# Patient Record
Sex: Male | Born: 1980 | Race: Black or African American | Hispanic: No | Marital: Single | Smoking: Current every day smoker
Health system: Southern US, Community
[De-identification: ages and names within clinical notes are randomized; demographics above are authoritative.]

## PROBLEM LIST (undated history)

## (undated) DIAGNOSIS — Q250 Patent ductus arteriosus: Secondary | ICD-10-CM

## (undated) HISTORY — PX: CARDIAC SURGERY: SHX584

## (undated) HISTORY — PX: SPLENECTOMY, TOTAL: SHX788

---

## 1999-09-09 ENCOUNTER — Emergency Department (HOSPITAL_COMMUNITY): Admission: EM | Admit: 1999-09-09 | Discharge: 1999-09-09 | Payer: Self-pay | Admitting: *Deleted

## 2001-06-25 ENCOUNTER — Encounter: Payer: Self-pay | Admitting: Emergency Medicine

## 2001-06-25 ENCOUNTER — Inpatient Hospital Stay (HOSPITAL_COMMUNITY): Admission: AC | Admit: 2001-06-25 | Discharge: 2001-07-03 | Payer: Self-pay

## 2001-06-26 ENCOUNTER — Encounter: Payer: Self-pay | Admitting: Emergency Medicine

## 2001-06-28 ENCOUNTER — Encounter: Payer: Self-pay | Admitting: General Surgery

## 2001-06-30 ENCOUNTER — Encounter: Payer: Self-pay | Admitting: General Surgery

## 2001-07-03 ENCOUNTER — Encounter: Payer: Self-pay | Admitting: General Surgery

## 2007-06-24 ENCOUNTER — Emergency Department: Payer: Self-pay | Admitting: Unknown Physician Specialty

## 2011-03-26 ENCOUNTER — Inpatient Hospital Stay (INDEPENDENT_AMBULATORY_CARE_PROVIDER_SITE_OTHER)
Admission: RE | Admit: 2011-03-26 | Discharge: 2011-03-26 | Disposition: A | Discharge status: Home / Self Care | Payer: Self-pay | Source: Ambulatory Visit | Attending: Family Medicine | Admitting: Family Medicine

## 2011-03-26 ENCOUNTER — Ambulatory Visit (INDEPENDENT_AMBULATORY_CARE_PROVIDER_SITE_OTHER): Payer: Self-pay

## 2011-03-26 DIAGNOSIS — M25519 Pain in unspecified shoulder: Secondary | ICD-10-CM

## 2011-03-26 DIAGNOSIS — M549 Dorsalgia, unspecified: Secondary | ICD-10-CM

## 2012-01-17 ENCOUNTER — Ambulatory Visit (HOSPITAL_COMMUNITY)
Admission: RE | Admit: 2012-01-17 | Discharge: 2012-01-17 | Disposition: A | Discharge status: Home / Self Care | Payer: Medicaid Other | Source: Ambulatory Visit | Attending: Cardiology | Admitting: Cardiology

## 2012-01-17 ENCOUNTER — Other Ambulatory Visit (HOSPITAL_COMMUNITY): Payer: Self-pay | Admitting: Cardiology

## 2012-01-17 DIAGNOSIS — M25519 Pain in unspecified shoulder: Secondary | ICD-10-CM | POA: Insufficient documentation

## 2012-01-17 DIAGNOSIS — Q249 Congenital malformation of heart, unspecified: Secondary | ICD-10-CM

## 2012-01-17 DIAGNOSIS — R52 Pain, unspecified: Secondary | ICD-10-CM

## 2012-01-17 LAB — CBC
HCT: 43.6 % (ref 39.0–52.0)
Hemoglobin: 14.9 g/dL (ref 13.0–17.0)
MCH: 32.1 pg (ref 26.0–34.0)
MCHC: 34.2 g/dL (ref 30.0–36.0)
MCV: 94 fL (ref 78.0–100.0)
Platelets: 238 10*3/uL (ref 150–400)
RBC: 4.64 MIL/uL (ref 4.22–5.81)
RDW: 14.8 % (ref 11.5–15.5)
WBC: 10.6 10*3/uL — ABNORMAL HIGH (ref 4.0–10.5)

## 2012-01-17 LAB — COMPREHENSIVE METABOLIC PANEL
Alkaline Phosphatase: 49 U/L (ref 39–117)
BUN: 9 mg/dL (ref 6–23)
Chloride: 107 mEq/L (ref 96–112)
Creatinine, Ser: 0.72 mg/dL (ref 0.50–1.35)
GFR calc Af Amer: >90 mL/min (ref >90)
Glucose, Bld: 89 mg/dL (ref 70–99)
Potassium: 3.9 mEq/L (ref 3.5–5.1)
Total Bilirubin: 0.5 mg/dL (ref 0.3–1.2)
Total Protein: 7.4 g/dL (ref 6.0–8.3)

## 2012-01-17 NOTE — Progress Notes (Signed)
  Echocardiogram 2D Echocardiogram has been performed.  Kelly Bowen 01/17/2012, 11:26 AM

## 2012-01-22 ENCOUNTER — Ambulatory Visit (HOSPITAL_COMMUNITY): Admission: RE | Admit: 2012-01-22 | Payer: Medicaid Other | Source: Ambulatory Visit

## 2012-03-26 ENCOUNTER — Ambulatory Visit: Payer: Medicaid Other | Attending: Orthopaedic Surgery | Admitting: Physical Therapy

## 2012-03-26 DIAGNOSIS — M25559 Pain in unspecified hip: Secondary | ICD-10-CM | POA: Insufficient documentation

## 2012-03-26 DIAGNOSIS — IMO0001 Reserved for inherently not codable concepts without codable children: Secondary | ICD-10-CM | POA: Insufficient documentation

## 2012-03-26 DIAGNOSIS — M545 Low back pain, unspecified: Secondary | ICD-10-CM | POA: Insufficient documentation

## 2012-04-06 ENCOUNTER — Encounter: Payer: Self-pay | Admitting: Physical Therapy

## 2012-04-14 ENCOUNTER — Ambulatory Visit: Payer: Medicaid Other | Attending: Orthopaedic Surgery | Admitting: Physical Therapy

## 2012-04-14 DIAGNOSIS — M545 Low back pain, unspecified: Secondary | ICD-10-CM | POA: Insufficient documentation

## 2012-04-14 DIAGNOSIS — M25559 Pain in unspecified hip: Secondary | ICD-10-CM | POA: Insufficient documentation

## 2012-04-14 DIAGNOSIS — IMO0001 Reserved for inherently not codable concepts without codable children: Secondary | ICD-10-CM | POA: Insufficient documentation

## 2012-04-21 ENCOUNTER — Ambulatory Visit: Payer: Medicaid Other | Admitting: Physical Therapy

## 2012-04-28 ENCOUNTER — Encounter: Payer: Self-pay | Admitting: Physical Therapy

## 2012-05-08 ENCOUNTER — Encounter: Payer: Self-pay | Admitting: Physical Therapy

## 2012-05-18 ENCOUNTER — Encounter: Payer: Self-pay | Admitting: Physical Therapy

## 2012-08-03 DIAGNOSIS — K029 Dental caries, unspecified: Secondary | ICD-10-CM | POA: Insufficient documentation

## 2012-08-03 DIAGNOSIS — F17200 Nicotine dependence, unspecified, uncomplicated: Secondary | ICD-10-CM | POA: Insufficient documentation

## 2012-08-04 ENCOUNTER — Emergency Department (HOSPITAL_COMMUNITY)
Admission: EM | Admit: 2012-08-04 | Discharge: 2012-08-04 | Disposition: A | Discharge status: Home / Self Care | Payer: Medicaid Other | Attending: Emergency Medicine | Admitting: Emergency Medicine

## 2012-08-04 ENCOUNTER — Encounter (HOSPITAL_COMMUNITY): Payer: Self-pay | Admitting: *Deleted

## 2012-08-04 DIAGNOSIS — K029 Dental caries, unspecified: Secondary | ICD-10-CM

## 2012-08-04 HISTORY — DX: Patent ductus arteriosus: Q25.0

## 2012-08-04 MED ORDER — TRAMADOL HCL 50 MG PO TABS: 50.0000 mg | ORAL_TABLET | Freq: Four times a day (QID) | ORAL | Status: DC | PRN

## 2012-08-04 MED ORDER — PENICILLIN V POTASSIUM 500 MG PO TABS
500.0000 mg | ORAL_TABLET | Freq: Once | ORAL | Status: AC
  Administered 2012-08-04: 500 mg via ORAL
  Filled 2012-08-04: qty 1

## 2012-08-04 MED ORDER — TRAMADOL HCL 50 MG PO TABS
50.0000 mg | ORAL_TABLET | Freq: Once | ORAL | Status: AC
  Administered 2012-08-04: 50 mg via ORAL
  Filled 2012-08-04: qty 1

## 2012-08-04 MED ORDER — PENICILLIN V POTASSIUM 500 MG PO TABS: 500.0000 mg | ORAL_TABLET | Freq: Four times a day (QID) | ORAL | Status: DC

## 2012-08-04 NOTE — ED Notes (Signed)
Pt c/o right lower jaw tooth abscess; pain x 2 days

## 2012-08-04 NOTE — ED Provider Notes (Signed)
Medical screening examination/treatment/procedure(s) were performed by non-physician practitioner and as supervising physician I was immediately available for consultation/collaboration.   Lyanne Co, MD 08/04/12 [phone]

## 2012-08-04 NOTE — ED Provider Notes (Signed)
History     CSN: [id number]  Arrival date & time 08/03/12  2356   First MD Initiated Contact with Patient 08/04/12 0015      Chief Complaint  Patient presents with  . Dental Pain    (Consider location/radiation/quality/duration/timing/severity/associated sxs/prior treatment) HPI Comments: Patient was sent to the emergency department tonight, complaining of left lower tooth and gum pain.  This is been going on for the last several, days.  He is taken over-the-counter Orajel and Goody's powder, without relief he, states he plans on going to [facility] in the next one to 2 days for medication.  He does not have a dentist  Patient is a 31 y.o. male presenting with tooth pain. The history is provided by the patient.  Dental PainThe primary symptoms include mouth pain. Primary symptoms do not include dental injury, headaches, fever or sore throat. The symptoms began yesterday.  Additional symptoms do not include: trouble swallowing and ear pain.    Past Medical History  Diagnosis Date  . Patent ductus arteriosus     Past Surgical History  Procedure Date  . Cardiac surgery   . Splenectomy, total     No family history on file.  History  Substance Use Topics  . Smoking status: Current Every Day Smoker -- 1.5 packs/day    Types: Cigarettes  . Smokeless tobacco: Not on file  . Alcohol Use: No      Review of Systems  Constitutional: Negative for fever.  HENT: Positive for dental problem. Negative for ear pain, sore throat, rhinorrhea and trouble swallowing.   Gastrointestinal: Negative for nausea.  Skin: Negative for rash and wound.  Neurological: Negative for dizziness and headaches.    Allergies  Review of patient's allergies indicates no known allergies.  Home Medications   Current Outpatient Rx  Name Route Sig Dispense Refill  . ACETAMINOPHEN 500 MG PO TABS Oral Take 1,000 mg by mouth every 6 (six) hours as needed. For dental    . GOODY HEADACHE PO Oral Take 1  packet by mouth every 6 (six) hours as needed. For pain    . BENZOCAINE 10 % MT GEL Mouth/Throat Use as directed 1 application in the mouth or throat as needed.    Marland Kitchen PENICILLIN V POTASSIUM 500 MG PO TABS Oral Take 1 tablet (500 mg total) by mouth 4 (four) times daily. 39 tablet 0  . TRAMADOL HCL 50 MG PO TABS Oral Take 1 tablet (50 mg total) by mouth every 6 (six) hours as needed for pain. 30 tablet 0    BP 170/81  Pulse 56  Temp 98 F (36.7 C)  Resp 20  SpO2 100%  Physical Exam  Constitutional: He appears well-developed and well-nourished.  HENT:  Head: Normocephalic.  Mouth/Throat:    Eyes: Pupils are equal, round, and reactive to light.  Neck: Normal range of motion.    ED Course  Procedures (including critical care time)  Labs Reviewed - No data to display No results found.   1. Simple dental cavity       MDM   Will treat with PCN and Ultram refer to DDS in AM        Arman Filter, NP 08/04/12 0120

## 2015-03-21 ENCOUNTER — Ambulatory Visit (HOSPITAL_COMMUNITY)
Admission: RE | Admit: 2015-03-21 | Discharge: 2015-03-21 | Disposition: A | Discharge status: Home / Self Care | Payer: No Typology Code available for payment source | Source: Ambulatory Visit | Attending: Chiropractic Medicine | Admitting: Chiropractic Medicine

## 2015-03-21 ENCOUNTER — Other Ambulatory Visit (HOSPITAL_COMMUNITY): Payer: Self-pay | Admitting: Chiropractic Medicine

## 2015-03-21 DIAGNOSIS — M25512 Pain in left shoulder: Secondary | ICD-10-CM

## 2015-03-21 DIAGNOSIS — M5489 Other dorsalgia: Secondary | ICD-10-CM

## 2015-03-21 DIAGNOSIS — M545 Low back pain: Secondary | ICD-10-CM | POA: Insufficient documentation

## 2015-03-21 DIAGNOSIS — M4322 Fusion of spine, cervical region: Secondary | ICD-10-CM | POA: Diagnosis not present

## 2015-03-21 DIAGNOSIS — M79642 Pain in left hand: Secondary | ICD-10-CM | POA: Diagnosis not present

## 2015-03-21 DIAGNOSIS — M542 Cervicalgia: Secondary | ICD-10-CM

## 2015-03-21 DIAGNOSIS — M5136 Other intervertebral disc degeneration, lumbar region: Secondary | ICD-10-CM | POA: Insufficient documentation

## 2016-03-20 ENCOUNTER — Emergency Department (HOSPITAL_COMMUNITY): Payer: Medicaid Other

## 2016-03-20 ENCOUNTER — Emergency Department (HOSPITAL_COMMUNITY)
Admission: EM | Admit: 2016-03-20 | Discharge: 2016-03-20 | Disposition: A | Discharge status: Home / Self Care | Payer: Medicaid Other | Attending: Emergency Medicine | Admitting: Emergency Medicine

## 2016-03-20 ENCOUNTER — Encounter (HOSPITAL_COMMUNITY): Payer: Self-pay

## 2016-03-20 DIAGNOSIS — F17210 Nicotine dependence, cigarettes, uncomplicated: Secondary | ICD-10-CM | POA: Insufficient documentation

## 2016-03-20 DIAGNOSIS — Z792 Long term (current) use of antibiotics: Secondary | ICD-10-CM | POA: Insufficient documentation

## 2016-03-20 DIAGNOSIS — Z8774 Personal history of (corrected) congenital malformations of heart and circulatory system: Secondary | ICD-10-CM | POA: Insufficient documentation

## 2016-03-20 DIAGNOSIS — M542 Cervicalgia: Secondary | ICD-10-CM | POA: Insufficient documentation

## 2016-03-20 MED ORDER — NAPROXEN 500 MG PO TABS: 500.0000 mg | ORAL_TABLET | Freq: Two times a day (BID) | ORAL | Status: DC

## 2016-03-20 MED ORDER — NAPROXEN 250 MG PO TABS
500.0000 mg | ORAL_TABLET | Freq: Once | ORAL | Status: AC
  Administered 2016-03-20: 500 mg via ORAL
  Filled 2016-03-20: qty 2

## 2016-03-20 MED ORDER — DIAZEPAM 5 MG PO TABS: 5.0000 mg | ORAL_TABLET | Freq: Two times a day (BID) | ORAL | Status: DC

## 2016-03-20 MED ORDER — DIAZEPAM 5 MG PO TABS
5.0000 mg | ORAL_TABLET | Freq: Once | ORAL | Status: AC
Start: 2016-03-20 — End: 2016-03-20
  Administered 2016-03-20: 5 mg via ORAL
  Filled 2016-03-20: qty 1

## 2016-03-20 NOTE — Discharge Instructions (Signed)
Mr. Kelly Bowen,  Nice meeting you! Please follow-up with your primary care provider. If you do not have one, please call the number listed in the information packet. You may need further evaluation/imaging after one-two weeks. Return to the emergency department if you develop increased pain, inability to walk, new/worsening symptoms. Feel better soon!  S. Lane HackerNicole Eurydice Calixto, PA-C

## 2016-03-20 NOTE — ED Provider Notes (Signed)
CSN: [id number]     Arrival date & time 03/20/16  1633 History  By signing my name below, I, Kelly Bowen, attest that this documentation has been prepared under the direction and in the presence of Melton Krebs, PA-C. Electronically Signed: Evon Bowen, ED Scribe. 03/20/2016. 5:56 PM.    Chief Complaint  Patient presents with  . Neck Pain   Patient is a 35 y.o. male presenting with neck pain. The history is provided by the patient. No language interpreter was used.  Neck Pain Associated symptoms: no fever and no headaches    HPI Comments: Kelly Bowen is a 35 y.o. male who presents to the Emergency Department complaining of right sided neck pain onset 3 weeks prior. Pt doesn't report any associated symptoms. Pt states that he has tried a pain medication with no relief. Pt states that he is unable to remember the name of the medication. He reports some relief when lifting the left arm pain. Pt states that certain movements make the pain worse. Denies fever, chills, nausea, vomiting or HA. Pt denies injury or trauma. Pt denies IV drug use.    Past Medical History  Diagnosis Date  . Patent ductus arteriosus    Past Surgical History  Procedure Laterality Date  . Cardiac surgery    . Splenectomy, total     No family history on file. Social History  Substance Use Topics  . Smoking status: Current Every Day Smoker -- 1.50 packs/day    Types: Cigarettes  . Smokeless tobacco: Never Used  . Alcohol Use: No    Review of Systems  Constitutional: Negative for fever and chills.  Gastrointestinal: Negative for nausea and vomiting.  Musculoskeletal: Positive for neck pain.  Neurological: Negative for headaches.  A complete 10 system review of systems was obtained and all systems are negative except as noted in the HPI and PMH.      Allergies  Review of patient's allergies indicates no known allergies.  Home Medications   Prior to Admission medications   Medication  Sig Start Date End Date Taking? Authorizing Provider  acetaminophen (TYLENOL) 500 MG tablet Take 1,000 mg by mouth every 6 (six) hours as needed. For dental    Historical Provider, MD  Aspirin-Acetaminophen-Caffeine (GOODY HEADACHE PO) Take 1 packet by mouth every 6 (six) hours as needed. For pain    Historical Provider, MD  benzocaine (ORAJEL) 10 % mucosal gel Use as directed 1 application in the mouth or throat as needed.    Historical Provider, MD  penicillin v potassium (VEETID) 500 MG tablet Take 1 tablet (500 mg total) by mouth 4 (four) times daily. 08/04/12   Earley Favor, NP  traMADol (ULTRAM) 50 MG tablet Take 1 tablet (50 mg total) by mouth every 6 (six) hours as needed for pain. 08/04/12   Earley Favor, NP   BP 122/72 mmHg  Pulse 76  Temp(Src) 97.8 F (36.6 C) (Oral)  Resp 17  SpO2 99%   Physical Exam  Constitutional: He is oriented to person, place, and time. He appears well-developed and well-nourished. No distress.  HENT:  Head: Normocephalic and atraumatic.  Eyes: Conjunctivae and EOM are normal.  Neck: Neck supple. No tracheal deviation present.  midline cervical tenderness and right cervical paraspinal  tenderness no step off or deformity  Cardiovascular: Normal rate.   Pulmonary/Chest: Effort normal. No respiratory distress.  Musculoskeletal: Normal range of motion.  No midline thoracic, lumbar spinal tenderness  Neurological: He is alert and oriented to  person, place, and time.  Skin: Skin is warm and dry.  Psychiatric: He has a normal mood and affect. His behavior is normal.  Nursing note and vitals reviewed.   ED Course  Procedures (including critical care time)   COORDINATION OF CARE: 4:43 PM-Discussed treatment plan with pt at bedside and pt agreed to plan.   Imaging Review Dg Cervical Spine Complete  03/20/2016  CLINICAL DATA:  Severe neck pain for 3 weeks.  No known injury. EXAM: CERVICAL SPINE - COMPLETE 4+ VIEW COMPARISON:  Cervical spine radiographs  03/21/2015. 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 The prevertebral soft tissues are normal. There is stable straightening of the usual cervical lordosis. There is no focal angulation or listhesis. No evidence of acute fracture or traumatic subluxation. There is spondylosis with loss of disc height, posterior osteophytes and uncinate spurring at C4-5 and C5-6, contributing to mild biforaminal narrowing. The posterior arch of C1 is incomplete. Surgical clips are present in the superior mediastinum. IMPRESSION: Stable cervical spine radiographs demonstrating prominent spondylosis for age. No acute osseous findings are evident. 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 Electronically Signed   By: Carey BullocksWilliam  Veazey M.D.   On: 03/20/2016 17:52   MDM   Final diagnoses:  Neck pain   Patient with neuro pain.  No neurological deficits and normal neuro exam.  No fever, night sweats, weight loss, h/o cancer, IVDU.  RICE protocol and pain medicine indicated and discussed with patient.   I personally performed the services described in this documentation, which was scribed in my presence. The recorded information has been reviewed and is accurate.   Melton KrebsSamantha Nicole Deajah Erkkila, PA-C 03/27/16 1941  Raeford RazorStephen Kohut, MD 03/28/16 0040

## 2016-03-20 NOTE — ED Notes (Signed)
Social work at bedside.  

## 2016-03-20 NOTE — ED Notes (Signed)
C/o neck pain x3 weeks denies injury

## 2016-03-20 NOTE — ED Notes (Signed)
Pt stable, ambulatory, states understanding of discharge instructions 

## 2016-04-05 ENCOUNTER — Inpatient Hospital Stay: Payer: Self-pay | Admitting: Family Medicine

## 2018-01-22 IMAGING — DX DG CERVICAL SPINE COMPLETE 4+V
6 series · 6 of 6 positions shown · non-contrast
Comparison: Cervical spine radiographs 03/21/2015.

CLINICAL DATA: Severe neck pain for 3 weeks.  No known injury.

EXAM:
CERVICAL SPINE - COMPLETE 4+ VIEW

[w cervical spine lat]
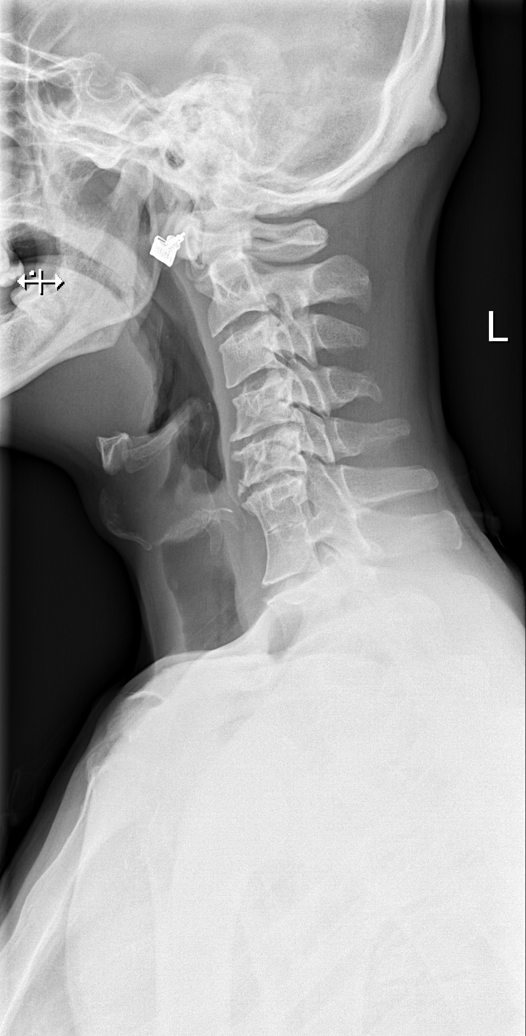

[w cervical swimmers]
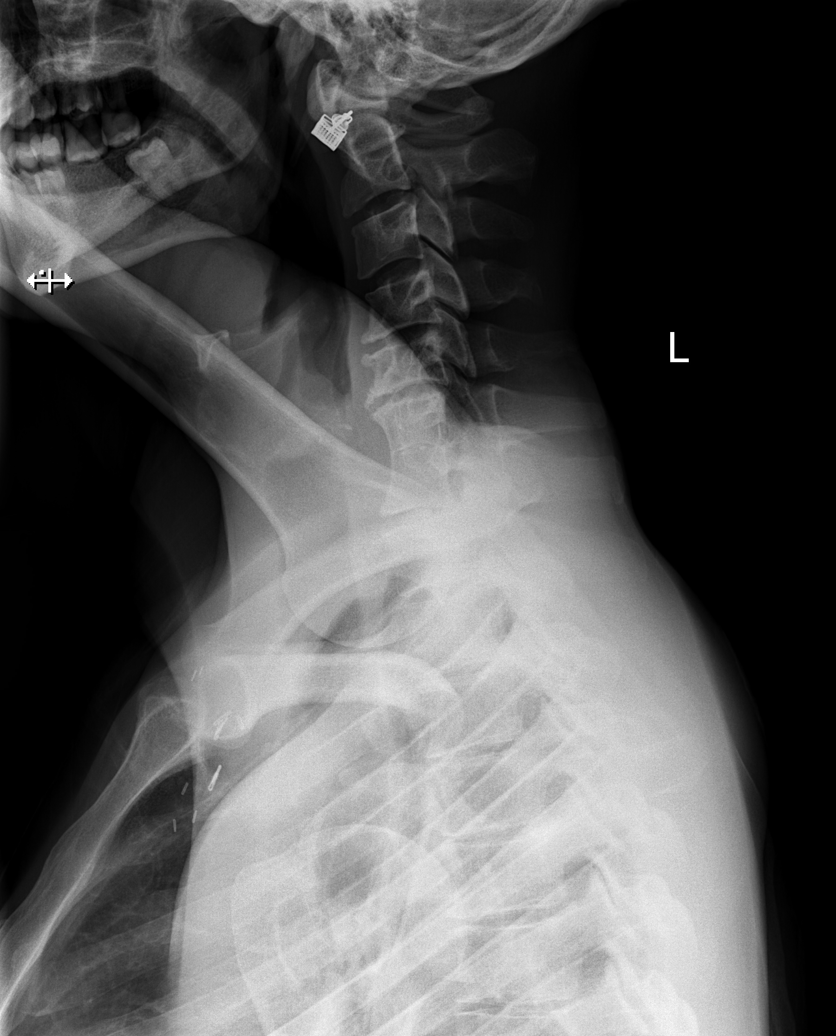

[w cervical spine ap_obl (1 of 2)]
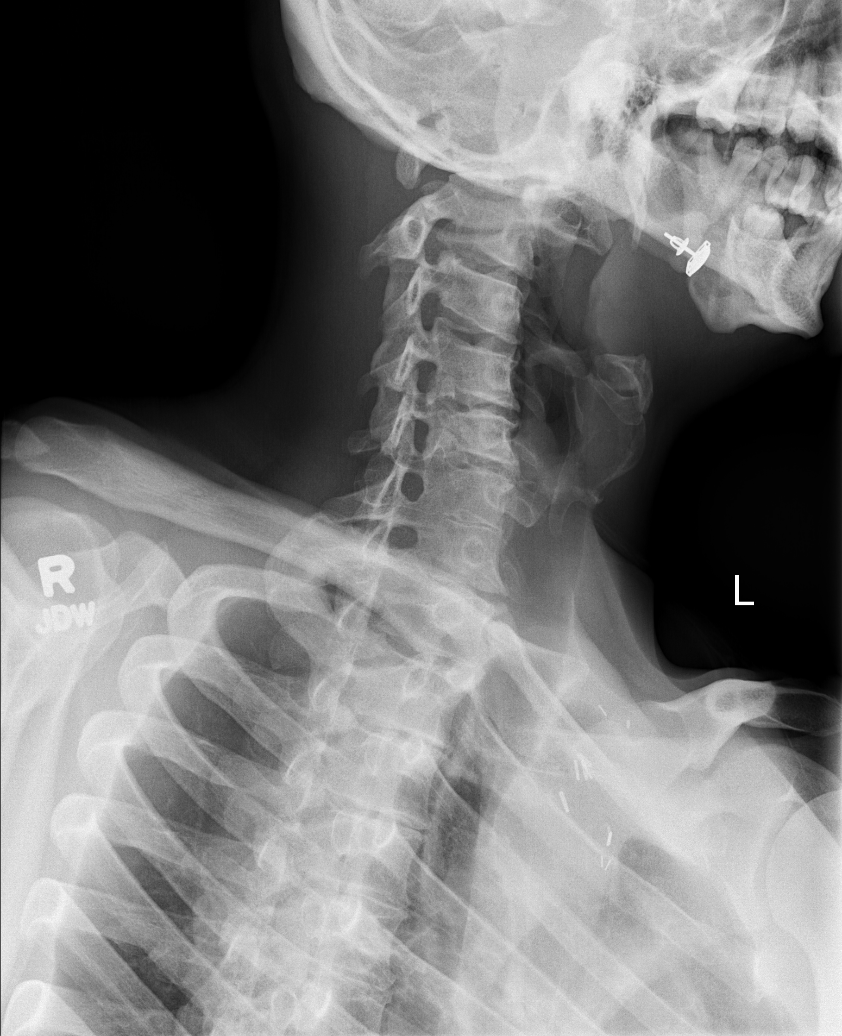

[w cervical spine ap_obl (2 of 2)]
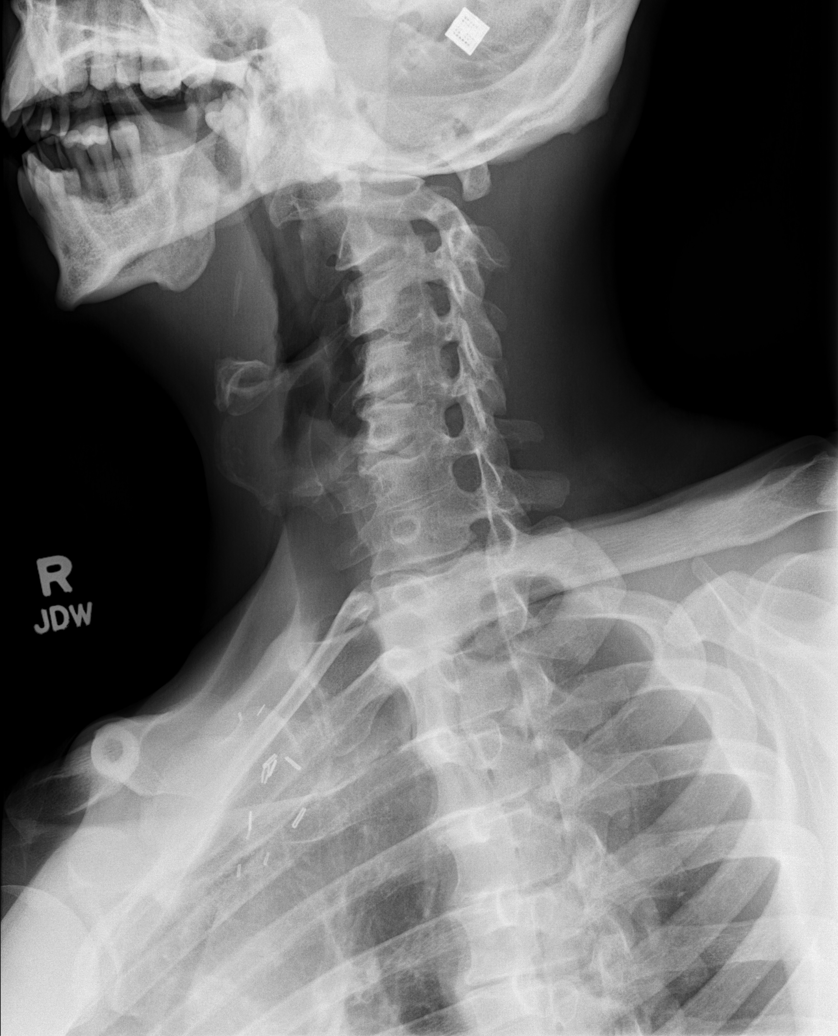

[w cervical spine ap]
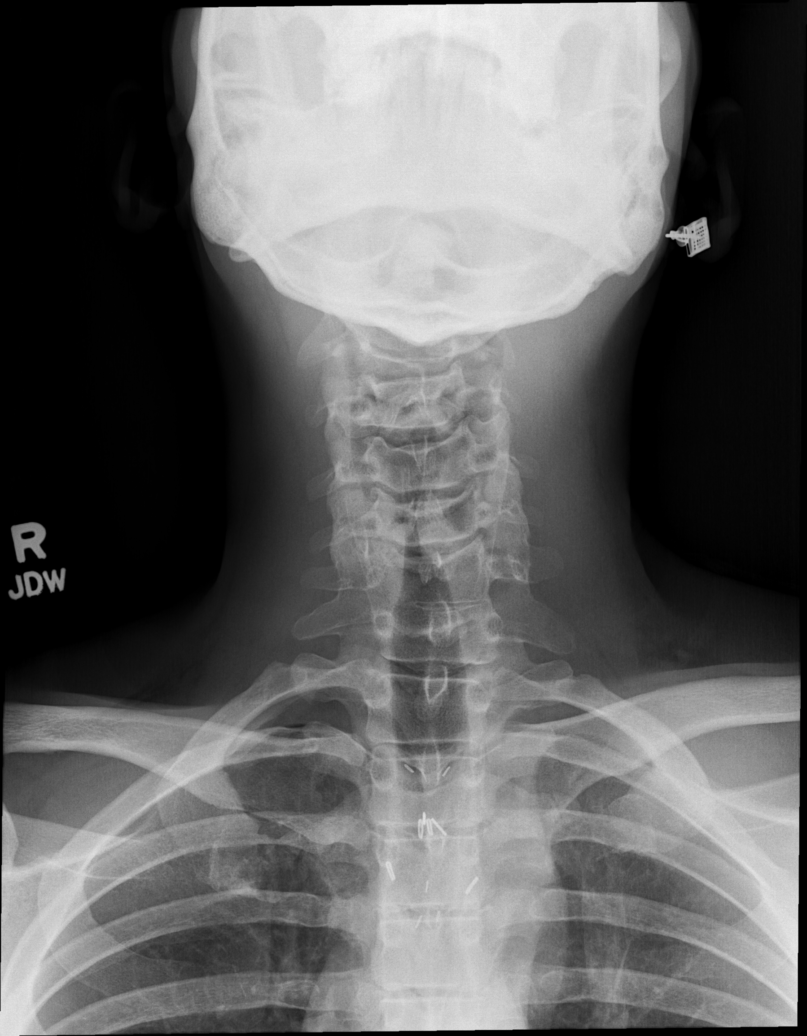

[w cervical spine odontoid]
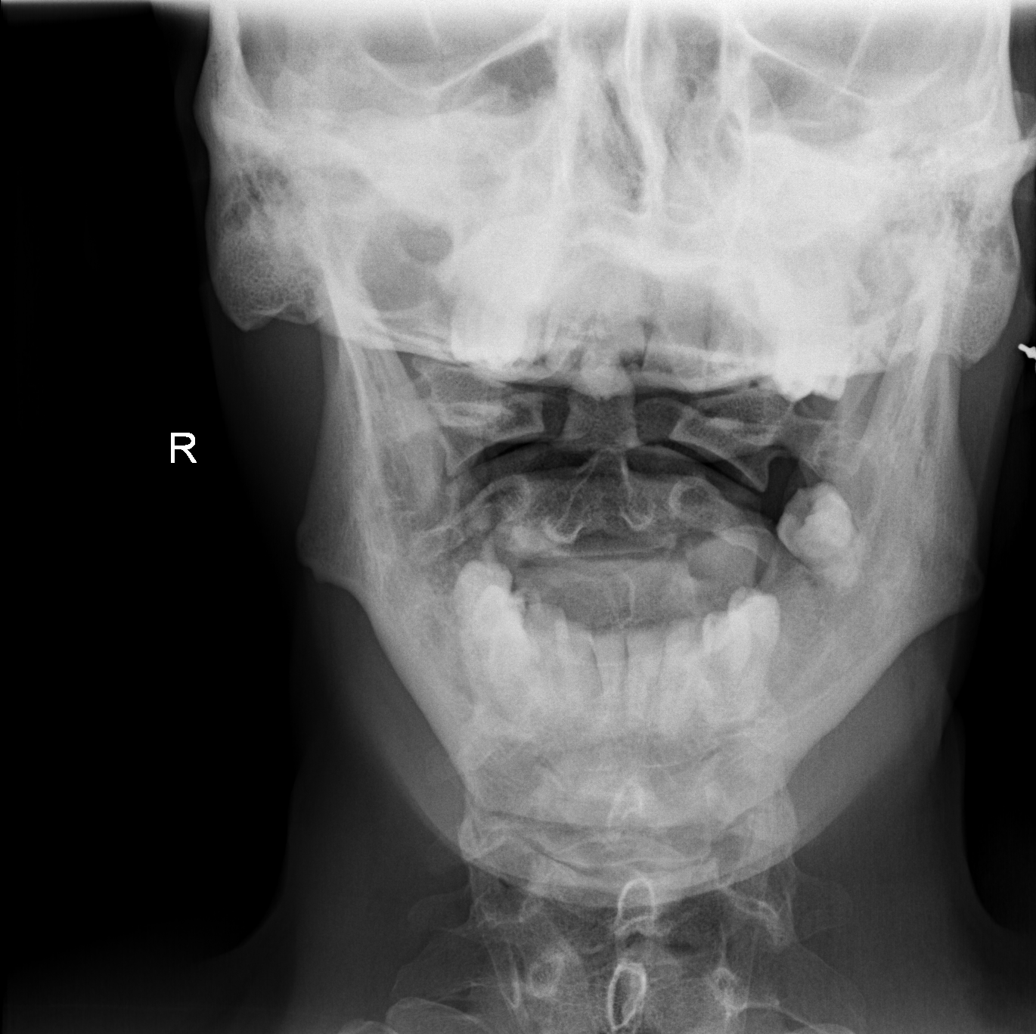

[6 of 6 positions shown; findings below may reference images not displayed]

FINDINGS: Congenital incomplete segmentation is again noted at C6-7. The
prevertebral soft tissues are normal. There is stable straightening
of the usual cervical lordosis. There is no focal angulation or
listhesis. No evidence of acute fracture or traumatic subluxation.
There is spondylosis with loss of disc height, posterior osteophytes
and uncinate spurring at C4-5 and C5-6, contributing to mild
biforaminal narrowing. The posterior arch of C1 is incomplete.
Surgical clips are present in the superior mediastinum.
IMPRESSION: Stable cervical spine radiographs demonstrating prominent
spondylosis for age. No acute osseous findings are evident.
Congenital incomplete segmentation at C6-7.

## 2019-04-14 ENCOUNTER — Other Ambulatory Visit: Payer: Self-pay

## 2019-04-14 ENCOUNTER — Ambulatory Visit (HOSPITAL_COMMUNITY)
Admission: EM | Admit: 2019-04-14 | Discharge: 2019-04-14 | Disposition: A | Discharge status: Home / Self Care | Payer: 59 | Attending: Family Medicine | Admitting: Family Medicine

## 2019-04-14 ENCOUNTER — Encounter (HOSPITAL_COMMUNITY): Payer: Self-pay | Admitting: Emergency Medicine

## 2019-04-14 DIAGNOSIS — M25511 Pain in right shoulder: Secondary | ICD-10-CM | POA: Diagnosis not present

## 2019-04-14 DIAGNOSIS — M778 Other enthesopathies, not elsewhere classified: Secondary | ICD-10-CM

## 2019-04-14 MED ORDER — DICLOFENAC SODIUM 75 MG PO TBEC: 75.0000 mg | DELAYED_RELEASE_TABLET | Freq: Two times a day (BID) | ORAL | 0 refills | Status: DC

## 2019-04-14 NOTE — ED Provider Notes (Signed)
[facility]    CSN: [id number] Arrival date & time: 04/14/19  [id number]     History   Chief Complaint Chief Complaint  Patient presents with  . Arm Problem    HPI Kelly Bowen is a 38 y.o. male.   Kelly Bowen presents with complaints of right shoulder, elbow, upper arm pain which has been persistent for greater than a month. Worse with lifting. He works two physical jobs with heavy lifting, and feels this worsens his symptoms. Pain typically originates to right elbow and radiates up arm. Pain also to shoulder occasional some upper arm tingling but no hand numbness or tingling. No wrist pain. No redness, swelling or warmth. Hasn't taken any medications for symptoms. He is left handed. 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 No previous right shoulder pain or surgery. No specific known injury. No trauma. Pain 6/10 at rest, movement can increase to 10/10. He does not experience pain at night while sleeping. Hx of PDA and heart surgery as child, no other medical history.     ROS per HPI, negative if not otherwise mentioned.      Past Medical History:  Diagnosis Date  . Patent ductus arteriosus     There are no active problems to display for this patient.   Past Surgical History:  Procedure Laterality Date  . CARDIAC SURGERY    . SPLENECTOMY, TOTAL         Home Medications    Prior to Admission medications   Medication Sig Start Date End Date Taking? Authorizing Provider  diclofenac (VOLTAREN) 75 MG EC tablet Take 1 tablet (75 mg total) by mouth 2 (two) times daily. 04/14/19   Georgetta HaberBurky, Adama Ferber B, NP    Family History Family History  Family history unknown: Yes    Social History Social History   Tobacco Use  . Smoking status: Current Every Day Smoker    Packs/day: 1.50    Types: Cigarettes  . Smokeless tobacco: Never Used  Substance Use Topics  . Alcohol use: Yes  . Drug use: Not Currently    Types: Marijuana      Allergies   Patient has no known allergies.   Review of Systems Review of Systems   Physical Exam Triage Vital Signs ED Triage Vitals  Enc Vitals Group     BP 04/14/19 1003 132/75     Pulse Rate 04/14/19 1003 70     Resp 04/14/19 1003 18     Temp 04/14/19 1003 98.4 F (36.9 C)     Temp Source 04/14/19 1003 Oral     SpO2 04/14/19 1003 97 %     Weight --      Height --      Head Circumference --      Peak Flow --      Pain Score 04/14/19 0959 6     Pain Loc --      Pain Edu? --      Excl. in [place]? --    No data found.  Updated Vital Signs BP 132/75 (BP Location: Left Arm)   Pulse 70   Temp 98.4 F (36.9 C) (Oral)   Resp 18   SpO2 97%    Physical Exam Constitutional:      Appearance: He is well-developed.  Cardiovascular:     Rate and Rhythm: Normal rate and regular rhythm.  Pulmonary:     Effort: Pulmonary effort is normal.  Breath sounds: Normal breath sounds.  Musculoskeletal:     Right shoulder: He exhibits decreased range of motion, tenderness, bony tenderness and pain. He exhibits no swelling, no effusion, no crepitus, no deformity, no laceration, no spasm, normal pulse and normal strength.     Right elbow: He exhibits normal range of motion, no swelling, no effusion, no deformity and no laceration. Tenderness found.     Comments: Tenderness at proximal and distal bicep tendon; also with bony tenderness to shoulder; no redness warmth or swelling; pain with supination of forearm and with elbow flexion; pain with external rotation of shoulder; strength equal however; strong radial pulse; cap refill < 2 seconds and gross sensation intact   Skin:    General: Skin is warm and dry.  Neurological:     Mental Status: He is alert and oriented to person, place, and time.      UC Treatments / Results  Labs (all labs ordered are listed, but only abnormal results are displayed) Labs Reviewed - No data to display  EKG None  Radiology No results found.   Procedures Procedures (including critical care time)  Medications Ordered in UC Medications - No data to display  Initial Impression / Assessment and Plan / UC Course  I have reviewed the triage vital signs and the nursing notes.  Pertinent labs & imaging results that were available during my care of the patient were reviewed by me and considered in my medical decision making (see chart for details).     Arthritis vs tendonitis vs strain vs combination considered and discussed. Pain >1 month without trauma or injury. Deferred imaging at this time. Encouraged a few days of rest and limited lifting. Ice, nsaids. Follow up with sports medicine as needed. Patient verbalized understanding and agreeable to plan.   Final Clinical Impressions(s) / UC Diagnoses   Final diagnoses:  Acute pain of right shoulder  Elbow tendonitis     Discharge Instructions     Range of motion as able.  Ice application, especially after working.  Diclofenac twice a day to help with pain, take with food. Don't take any additional ibuprofen.  Please follow up with Sports Medicine as needed if symptoms persist.    ED Prescriptions    Medication Sig Dispense Auth. Provider   diclofenac (VOLTAREN) 75 MG EC tablet Take 1 tablet (75 mg total) by mouth 2 (two) times daily. 30 tablet Zigmund Gottron, NP     Controlled Substance Prescriptions [place] Controlled Substance Registry consulted? Not Applicable   Zigmund Gottron, NP 04/14/19 1039

## 2019-04-14 NOTE — Discharge Instructions (Signed)
Range of motion as able.  Ice application, especially after working.  Diclofenac twice a day to help with pain, take with food. Don't take any additional ibuprofen.  Please follow up with Sports Medicine as needed if symptoms persist.

## 2019-04-14 NOTE — ED Triage Notes (Signed)
Symptoms for a month. Pain from right neck down right arm to below right elbow.  Pain with flexion and extension.  No known injury.  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

## 2019-05-10 ENCOUNTER — Other Ambulatory Visit: Payer: Self-pay

## 2019-05-10 ENCOUNTER — Encounter (HOSPITAL_COMMUNITY): Payer: Self-pay

## 2019-05-10 ENCOUNTER — Ambulatory Visit (HOSPITAL_COMMUNITY)
Admission: EM | Admit: 2019-05-10 | Discharge: 2019-05-10 | Disposition: A | Discharge status: Home / Self Care | Payer: 59 | Attending: Family Medicine | Admitting: Family Medicine

## 2019-05-10 DIAGNOSIS — K047 Periapical abscess without sinus: Secondary | ICD-10-CM

## 2019-05-10 MED ORDER — AMOXICILLIN-POT CLAVULANATE 875-125 MG PO TABS: 1.0000 | ORAL_TABLET | Freq: Two times a day (BID) | ORAL | 0 refills | Status: DC

## 2019-05-10 MED ORDER — NAPROXEN 500 MG PO TABS: 500.0000 mg | ORAL_TABLET | Freq: Two times a day (BID) | ORAL | 0 refills | Status: DC

## 2019-05-10 NOTE — ED Triage Notes (Signed)
Pt presents with dental pain and swelling on left side.

## 2019-05-10 NOTE — ED Provider Notes (Signed)
[facility]    CSN: [id number] Arrival date & time: 05/10/19  1053     History   Chief Complaint Chief Complaint  Patient presents with  . Dental Pain    HPI Kelly Bowen is a 38 y.o. male.   Patient is a 38 year old male with approximate 1 week of worsening left upper dental pain with left facial swelling.  He has been taking Tylenol without much relief.  Denies any draining from the mouth or trismus.  Has contacted dentist and plans to see them sometime next week.  Multiple dental caries with lack of dental care.  Denies any associated fevers or injuries to the mouth.  ROS per HPI      Past Medical History:  Diagnosis Date  . Patent ductus arteriosus     There are no active problems to display for this patient.   Past Surgical History:  Procedure Laterality Date  . CARDIAC SURGERY    . SPLENECTOMY, TOTAL         Home Medications    Prior to Admission medications   Medication Sig Start Date End Date Taking? Authorizing Provider  amoxicillin-clavulanate (AUGMENTIN) 875-125 MG tablet Take 1 tablet by mouth every 12 (twelve) hours. 05/10/19   Loura Halt A, NP  diclofenac (VOLTAREN) 75 MG EC tablet Take 1 tablet (75 mg total) by mouth 2 (two) times daily. 04/14/19   Zigmund Gottron, NP  naproxen (NAPROSYN) 500 MG tablet Take 1 tablet (500 mg total) by mouth 2 (two) times daily. 05/10/19   Orvan July, NP    Family History Family History  Family history unknown: Yes    Social History Social History   Tobacco Use  . Smoking status: Current Every Day Smoker    Packs/day: 1.50    Types: Cigarettes  . Smokeless tobacco: Never Used  Substance Use Topics  . Alcohol use: Yes  . Drug use: Not Currently    Types: Marijuana     Allergies   Patient has no known allergies.   Review of Systems Review of Systems   Physical Exam Triage Vital Signs ED Triage Vitals [05/10/19 1119]  Enc Vitals Group     BP (!) 151/82     Pulse Rate 61   Resp 18     Temp 98.7 F (37.1 C)     Temp Source Oral     SpO2 97 %     Weight      Height      Head Circumference      Peak Flow      Pain Score 8     Pain Loc      Pain Edu?      Excl. in [place]?    No data found.  Updated Vital Signs BP (!) 151/82 (BP Location: Left Arm)   Pulse 61   Temp 98.7 F (37.1 C) (Oral)   Resp 18   SpO2 97%   Visual Acuity Right Eye Distance:   Left Eye Distance:   Bilateral Distance:    Right Eye Near:   Left Eye Near:    Bilateral Near:     Physical Exam Vitals signs and nursing note reviewed.  Constitutional:      General: He is not in acute distress.    Appearance: Normal appearance. He is not ill-appearing, toxic-appearing or diaphoretic.  HENT:     Head: Normocephalic.     Mouth/Throat:     Dentition: Does not have dentures. Dental tenderness,  gingival swelling and dental caries present.     Comments: Moderate left facial swelling   Neurological:     Mental Status: He is alert.      UC Treatments / Results  Labs (all labs ordered are listed, but only abnormal results are displayed) Labs Reviewed - No data to display  EKG   Radiology No results found.  Procedures Procedures (including critical care time)  Medications Ordered in UC Medications - No data to display  Initial Impression / Assessment and Plan / UC Course  I have reviewed the triage vital signs and the nursing notes.  Pertinent labs & imaging results that were available during my care of the patient were reviewed by me and considered in my medical decision making (see chart for details).     Treating for dental infection with Augmentin and naproxen for pain inflammation Recommended following up with a dentist as planned If symptoms worsen he will need to be seen in the ER Final Clinical Impressions(s) / UC Diagnoses   Final diagnoses:  Dental infection     Discharge Instructions     Take the medication as prescribed Recommend taking the  naproxen with food Follow-up with your dentist as planned    ED Prescriptions    Medication Sig Dispense Auth. Provider   amoxicillin-clavulanate (AUGMENTIN) 875-125 MG tablet Take 1 tablet by mouth every 12 (twelve) hours. 14 tablet River Ambrosio A, NP   naproxen (NAPROSYN) 500 MG tablet Take 1 tablet (500 mg total) by mouth 2 (two) times daily. 30 tablet Shalla Bulluck, TraciDahlia Byes A, NP     Controlled Substance Prescriptions [place] Controlled Substance Registry consulted? Not Applicable   Janace ArisBast, Leesha Veno A, NP 05/10/19 1140

## 2019-05-10 NOTE — Discharge Instructions (Addendum)
Take the medication as prescribed Recommend taking the naproxen with food Follow-up with your dentist as planned

## 2019-11-08 ENCOUNTER — Ambulatory Visit: Payer: Self-pay | Attending: Internal Medicine

## 2019-11-08 DIAGNOSIS — Z20822 Contact with and (suspected) exposure to covid-19: Secondary | ICD-10-CM | POA: Insufficient documentation

## 2019-11-10 ENCOUNTER — Telehealth: Payer: Self-pay

## 2019-11-10 NOTE — Telephone Encounter (Signed)
Caller advise result not back yet 

## 2019-11-11 ENCOUNTER — Telehealth: Payer: Self-pay

## 2019-11-11 ENCOUNTER — Ambulatory Visit: Payer: Self-pay | Attending: Internal Medicine

## 2019-11-11 DIAGNOSIS — Z20822 Contact with and (suspected) exposure to covid-19: Secondary | ICD-10-CM | POA: Insufficient documentation

## 2019-11-11 LAB — NOVEL CORONAVIRUS, NAA

## 2019-11-11 NOTE — Telephone Encounter (Signed)
Patient was informed that the test was  not performed. Specimen could not be located. I advised patient that he would need to retest and patient hung up the phone on me.

## 2019-11-11 NOTE — Telephone Encounter (Signed)
Contacted [facility] and spoke with representative, Clent Jacks. Clent Jacks states that she is able to see that the specimen was received but is not able to she a physical location of the specimen in the department. Jeanell Sparrow states that she will reach out to the Molecular department via email in an attempt to locate the specimen. Clent Jacks states that she will call back or someone from the Molecular department will call back with more information.

## 2019-11-11 NOTE — Telephone Encounter (Signed)
Patient called back again to get schedule for the 230 appointment this afternoon.I was able to get patient schedule but he was still upset that he has to come back to retest.

## 2019-11-13 LAB — NOVEL CORONAVIRUS, NAA: SARS-CoV-2, NAA: NOT DETECTED

## 2024-09-08 ENCOUNTER — Ambulatory Visit (HOSPITAL_COMMUNITY)
Admission: EM | Admit: 2024-09-08 | Discharge: 2024-09-08 | Disposition: A | Discharge status: Home / Self Care | Attending: Physician Assistant | Admitting: Physician Assistant

## 2024-09-08 ENCOUNTER — Ambulatory Visit (INDEPENDENT_AMBULATORY_CARE_PROVIDER_SITE_OTHER)

## 2024-09-08 ENCOUNTER — Encounter (HOSPITAL_COMMUNITY): Payer: Self-pay

## 2024-09-08 DIAGNOSIS — M542 Cervicalgia: Secondary | ICD-10-CM | POA: Diagnosis not present

## 2024-09-08 DIAGNOSIS — M25512 Pain in left shoulder: Secondary | ICD-10-CM

## 2024-09-08 MED ORDER — METHOCARBAMOL 500 MG PO TABS: 500.0000 mg | ORAL_TABLET | Freq: Two times a day (BID) | ORAL | 0 refills | Status: AC

## 2024-09-08 MED ORDER — NAPROXEN 375 MG PO TABS: 375.0000 mg | ORAL_TABLET | Freq: Two times a day (BID) | ORAL | 0 refills | Status: AC

## 2024-09-08 MED ORDER — LIDOCAINE 5 % EX PTCH: 1.0000 | MEDICATED_PATCH | CUTANEOUS | 0 refills | Status: AC

## 2024-09-08 NOTE — ED Triage Notes (Signed)
 Pt states left arm pain states the pain is worse when he moves his arm.  Also states his neck hurts and is stiff.  States he hasn't taken anything at home for the pain.
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
## 2024-09-08 NOTE — Discharge Instructions (Signed)
 Your x-rays did not show any broken bones or anything out of place.  Please start Naprosyn  twice a day.  Do not take other NSAIDs with this medication including aspirin, ibuprofen/Advil, naproxen /Aleve .  You can use acetaminophen/Tylenol for breakthrough pain.  Take Robaxin up to 2 times a day.  This will make you sleepy so do not drive or drink alcohol taking it.  Apply lidocaine patch for 12 hours during the day; remove this for 12 hours at night.  Use only 1 patch per 24 hours.  I recommend you follow-up with sports medicine; call to schedule an appointment.  If anything worsens and you have severe pain, numbness or tingling in your hand, headache, vision change, rash, fever, nausea/vomiting you need to be seen immediately.
